# Patient Record
Sex: Female | Born: 1935 | Race: White | Hispanic: No | Smoking: Former smoker
Health system: Southern US, Community
[De-identification: ages and names within clinical notes are randomized; demographics above are authoritative.]

## PROBLEM LIST (undated history)

## (undated) DIAGNOSIS — R55 Syncope and collapse: Secondary | ICD-10-CM

## (undated) DIAGNOSIS — E785 Hyperlipidemia, unspecified: Secondary | ICD-10-CM

## (undated) DIAGNOSIS — D649 Anemia, unspecified: Secondary | ICD-10-CM

## (undated) DIAGNOSIS — I10 Essential (primary) hypertension: Secondary | ICD-10-CM

## (undated) DIAGNOSIS — I739 Peripheral vascular disease, unspecified: Secondary | ICD-10-CM

## (undated) DIAGNOSIS — E039 Hypothyroidism, unspecified: Secondary | ICD-10-CM

## (undated) DIAGNOSIS — I639 Cerebral infarction, unspecified: Secondary | ICD-10-CM

## (undated) HISTORY — DX: Hyperlipidemia, unspecified: E78.5

## (undated) HISTORY — DX: Anemia, unspecified: D64.9

## (undated) HISTORY — PX: CARDIAC CATHETERIZATION: SHX172

## (undated) HISTORY — DX: Cerebral infarction, unspecified: I63.9

## (undated) HISTORY — PX: VASCULAR SURGERY: SHX849

## (undated) HISTORY — DX: Peripheral vascular disease, unspecified: I73.9

## (undated) HISTORY — DX: Essential (primary) hypertension: I10

## (undated) HISTORY — DX: Hypothyroidism, unspecified: E03.9

## (undated) HISTORY — DX: Syncope and collapse: R55

---

## 2006-07-15 ENCOUNTER — Observation Stay (HOSPITAL_COMMUNITY): Admission: RE | Admit: 2006-07-15 | Discharge: 2006-07-16 | Payer: Self-pay | Admitting: *Deleted

## 2006-11-26 ENCOUNTER — Ambulatory Visit: Payer: Self-pay | Admitting: Otolaryngology

## 2006-12-14 ENCOUNTER — Other Ambulatory Visit: Payer: Self-pay

## 2006-12-14 ENCOUNTER — Inpatient Hospital Stay: Payer: Self-pay | Admitting: Endocrinology

## 2006-12-23 ENCOUNTER — Encounter: Payer: Self-pay | Admitting: Hematology & Oncology

## 2007-05-21 ENCOUNTER — Ambulatory Visit: Payer: Self-pay | Admitting: *Deleted

## 2007-05-25 ENCOUNTER — Encounter: Payer: Self-pay | Admitting: Cardiovascular Disease

## 2007-07-23 ENCOUNTER — Emergency Department: Payer: Self-pay | Admitting: Emergency Medicine

## 2007-07-23 ENCOUNTER — Other Ambulatory Visit: Payer: Self-pay

## 2007-12-13 ENCOUNTER — Emergency Department: Payer: Self-pay

## 2007-12-13 ENCOUNTER — Other Ambulatory Visit: Payer: Self-pay

## 2008-02-24 ENCOUNTER — Encounter: Admission: RE | Admit: 2008-02-24 | Discharge: 2008-02-24 | Payer: Self-pay | Admitting: Neurology

## 2008-03-23 ENCOUNTER — Emergency Department: Payer: Self-pay | Admitting: Emergency Medicine

## 2008-07-12 ENCOUNTER — Ambulatory Visit: Payer: Self-pay | Admitting: Gastroenterology

## 2008-07-27 ENCOUNTER — Encounter: Payer: Self-pay | Admitting: Cardiovascular Disease

## 2008-07-27 ENCOUNTER — Inpatient Hospital Stay (HOSPITAL_COMMUNITY): Admission: AD | Admit: 2008-07-27 | Discharge: 2008-07-29 | Payer: Self-pay | Admitting: Cardiology

## 2008-09-05 ENCOUNTER — Encounter: Payer: Self-pay | Admitting: Cardiovascular Disease

## 2008-10-05 ENCOUNTER — Ambulatory Visit: Payer: Self-pay | Admitting: Neurology

## 2009-01-03 ENCOUNTER — Encounter: Payer: Self-pay | Admitting: Cardiovascular Disease

## 2009-02-01 ENCOUNTER — Encounter: Payer: Self-pay | Admitting: Cardiovascular Disease

## 2009-02-23 ENCOUNTER — Ambulatory Visit: Payer: Self-pay | Admitting: Internal Medicine

## 2009-05-09 ENCOUNTER — Encounter: Payer: Self-pay | Admitting: Cardiovascular Disease

## 2009-07-13 ENCOUNTER — Ambulatory Visit: Payer: Self-pay | Admitting: Neurology

## 2009-11-23 ENCOUNTER — Ambulatory Visit: Payer: Self-pay | Admitting: Internal Medicine

## 2009-11-28 ENCOUNTER — Ambulatory Visit: Payer: Self-pay | Admitting: Internal Medicine

## 2009-11-29 ENCOUNTER — Ambulatory Visit: Payer: Self-pay | Admitting: Internal Medicine

## 2010-01-03 ENCOUNTER — Ambulatory Visit: Payer: Self-pay | Admitting: Specialist

## 2010-03-07 ENCOUNTER — Ambulatory Visit: Payer: Self-pay | Admitting: Internal Medicine

## 2011-01-07 ENCOUNTER — Encounter: Payer: Self-pay | Admitting: Cardiovascular Disease

## 2011-01-21 ENCOUNTER — Ambulatory Visit
Admission: RE | Admit: 2011-01-21 | Discharge: 2011-01-21 | Payer: Self-pay | Source: Home / Self Care | Attending: Cardiovascular Disease | Admitting: Cardiovascular Disease

## 2011-01-21 ENCOUNTER — Encounter: Payer: Self-pay | Admitting: Cardiovascular Disease

## 2011-01-21 DIAGNOSIS — E785 Hyperlipidemia, unspecified: Secondary | ICD-10-CM | POA: Insufficient documentation

## 2011-01-21 DIAGNOSIS — R011 Cardiac murmur, unspecified: Secondary | ICD-10-CM | POA: Insufficient documentation

## 2011-01-21 DIAGNOSIS — R5383 Other fatigue: Secondary | ICD-10-CM

## 2011-01-21 DIAGNOSIS — R0602 Shortness of breath: Secondary | ICD-10-CM | POA: Insufficient documentation

## 2011-01-21 DIAGNOSIS — I739 Peripheral vascular disease, unspecified: Secondary | ICD-10-CM | POA: Insufficient documentation

## 2011-01-21 DIAGNOSIS — R5381 Other malaise: Secondary | ICD-10-CM | POA: Insufficient documentation

## 2011-01-21 DIAGNOSIS — I2510 Atherosclerotic heart disease of native coronary artery without angina pectoris: Secondary | ICD-10-CM | POA: Insufficient documentation

## 2011-01-29 NOTE — Assessment & Plan Note (Signed)
Summary: NP6/AMD   Visit Type:  Initial Consult Primary Provider:  Aletha Halim, M.D.  CC:  Former patient at [facility].  c/o feeling tired and weak; doesn't have to do anything to feel worn out.  Has some shortness of breath with over exertion.Marland Kitchen  History of Present Illness: Linda Rasmussen is 77 year old woman with a history of coronary artery disease, peripheral vascular disease, previous stroke, hyperlipidemia who is intolerant of statins, hypertension, previous PCI to her lower extremity, chronic leg weakness, fatigue, long smoking history and COPD who presents to reestablish care. She was previously seen by myself at [facility].  She reports that for the past year, she has had weakness and fatigue. She is uncertain why she continues to have this. She is not very active but does have some shortness of breath with exertion. She does not walk long distances and has to take frequent breaks when exerting herself secondary to fatigue and breathing.   She has lab work from late in 2012 showing total cholesterol 220. She reports having a carotid ultrasound by Dr. Clelia Croft showing moderate disease of her left carotid but the details are unavailable to [place].  Cardiac catheterization performed in August 2009 showed circumflex vessel with 40% mid disease, 20-30% proximal disease, 80-90% mid to distal disease, LAD with 50% proximal, 70% mid disease, 95% ostial diagonal disease, 50% mid to distal disease of the LAD. She had stent placed to her left circumflex that was 2.5 x 14 mm endevour stent.  EKG today shows normal sinus rhythm with rate 63 beats per minute, possible old anterior infarct, interventricular conduction delay, nonspecific ST and T wave abnormality in anterolateral leads  Preventive Screening-Counseling & Management  Alcohol-Tobacco     Smoking Status: current  Caffeine-Diet-Exercise     Does Patient Exercise: no  Current Medications (verified): 1)  Atenolol 25 Mg Tabs  (Atenolol) .... One Tablet Once Daily 2)  Isosorbide Mononitrate Cr 30 Mg Xr24h-Tab (Isosorbide Mononitrate) .Marland Kitchen.. 1 Tablet Every Morning 3)  Plavix 75 Mg Tabs (Clopidogrel Bisulfate) .Marland Kitchen.. 1 Tablet Daily 4)  Buspirone Hcl 10 Mg Tabs (Buspirone Hcl) .Marland Kitchen.. 1 Tablet By Mouth Two Times A Day 5)  Gabapentin 400 Mg Caps (Gabapentin) .Marland Kitchen.. 1 Tablet Once Daily 6)  Zetia 10 Mg Tabs (Ezetimibe) .... One Tablet Once Daily 7)  Tramadol Hcl 50 Mg Tabs (Tramadol Hcl) .... One Tablet As Needed For Back Pain 8)  Vesicare 5 Mg Tabs (Solifenacin Succinate) .... As Needed 9)  Armour Thyroid 60 Mg Tabs (Thyroid) .... One Tablet Once Daily 10)  Calcium Carbonate 600 Mg Tabs (Calcium Carbonate) .... One Tablet Once Daily 11)  Daily Multi  Tabs (Multiple Vitamins-Minerals) .... One Tablet Once Daily  Allergies (verified): 1)  ! * Statins 2)  ! Trental 3)  ! * Lyrica 4)  ! * Rozepem 5)  ! * Keppra 6)  ! Reglan 7)  ! * Cymbalta  Past History:  Past Medical History: Last updated: 01/17/2011 Hypothyroidism Asthma Diabetes Type 2 Hypertension Anemia Hyperlipidemia Previous stroke PVD h/x of syncope  Family History: Last updated: 01/17/2011 Family history for coronary artery disease, diabetes, and stroke  Social History: Last updated: 01/21/2011 Retired  Widowed  Tobacco Use - Yes. Smokes >1/2 ppd. Started age 38. Alcohol Use - no Regular Exercise - no  Risk Factors: Exercise: no (01/21/2011)  Risk Factors: Smoking Status: current (01/21/2011)  Past Surgical History: Cardiac Cath; s/p stent placement @ [place] [facility]; carotid stenosis stents bilateral legs.  Dr. Orson Slick  Social History: Retired  Widowed  Tobacco Use - Yes. Smokes >1/2 ppd. Started age 53. Alcohol Use - no Regular Exercise - no Smoking Status:  current Does Patient Exercise:  no  Review of Systems       The patient complains of weight loss and dyspnea on exertion.  The patient denies fever, weight gain, vision  loss, decreased hearing, hoarseness, chest pain, syncope, peripheral edema, prolonged cough, abdominal pain, incontinence, muscle weakness, depression, and enlarged lymph nodes.         Fatigue, weakness  Vital Signs:  Patient profile:   77 year old female Height:      62 inches Weight:      106 pounds BMI:     19.46 Pulse rate:   63 / minute BP sitting:   98 / 54  (left arm) Cuff size:   regular  Vitals Entered By: Bishop Dublin, CMA (January 21, 2011 2:53 PM)  Physical Exam  General:  very thin woman in no apparent distress Head:  normocephalic and atraumatic Neck:  Neck supple, no JVD. No masses, thyromegaly or abnormal cervical nodes. Lungs:  mildly decreased breath sounds throughout Heart:  Non-displaced PMI, chest non-tender; regular rate and rhythm, S1, S2 without murmurs, rubs or gallops. Carotid upstroke normal, 1+ bruit b/l. Normal abdominal aortic size, no bruits. Femorals normal pulses, no bruits. Pedals normal pulses. No edema, no varicosities. Abdomen:   very thin Msk:  Back normal, normal gait. Muscle strength and tone normal. Pulses:  diminished pulses in her lower extremities Extremities:  No clubbing or cyanosis. Neurologic:  Alert and oriented x 3. Skin:  Intact without lesions or rashes. Psych:  Normal affect.   Impression & Recommendations:  Problem # 1:  CAD (ICD-414.00) she has a history of severe coronary artery disease with stent placement in 2009. She has worsening fatigue, malaise, some shortness of breath with exertion. We ordered a likely scan Myoview as she is unable to treadmill to rule out ischemia as a cause of her symptom.  Her updated medication list for this problem includes:    Atenolol 25 Mg Tabs (Atenolol) ..... One tablet once daily    Isosorbide Mononitrate Cr 30 Mg Xr24h-tab (Isosorbide mononitrate) .Marland Kitchen... 1 tablet every morning    Plavix 75 Mg Tabs (Clopidogrel bisulfate) .Marland Kitchen... 1 tablet daily  Orders: Nuclear Stress Test (Nuc Stress  Test)  Problem # 2:  MURMUR (ICD-785.2) She does have a murmur on exam, given her shortness of breath, Voltaren echocardiogram. This has not been done for many years.  Her updated medication list for this problem includes:    Atenolol 25 Mg Tabs (Atenolol) ..... One tablet once daily    Isosorbide Mononitrate Cr 30 Mg Xr24h-tab (Isosorbide mononitrate) .Marland Kitchen... 1 tablet every morning  Orders: Echocardiogram (Echo)  Problem # 3:  UNSPECIFIED PERIPHERAL VASCULAR DISEASE (ICD-443.9) She does have severe peripheral vascular disease. She continues to smoke. We have counseled her on smoking cessation and she reports that she will continue to try to stop smoking. We ordered a lower extremity ultrasound to rule out leg weakness. this can be done at the same time as her echocardiogram in our office for convenience.  Problem # 4:  HYPERLIPIDEMIA-MIXED (ICD-272.4) for her cholesterol, we have suggested she try Pravachol 20 mg daily in addition to zetia. She believes she has problems with most statins though we have asked her to try the weakest of the statins as it is imperative we push her cholesterol closer  to goal of 150.  Her updated medication list for this problem includes:    Zetia 10 Mg Tabs (Ezetimibe) ..... One tablet once daily    Pravastatin Sodium 20 Mg Tabs (Pravastatin sodium) .Marland Kitchen... Take one tablet by mouth daily at bedtime  Problem # 5:  FATIGUE / MALAISE (ICD-780.79) Etiology of her fatigue and general weakness may be multifactorial. We will exclude cardiac etiology with a stress test and echocardiogram. Ultrasound of her legs to exclude worsening peripheral vascular disease. She could be deconditioned as well as may have mild depression.  Other Orders: Arterial Duplex Lower Extremity (Arterial Duplex Low)  Patient Instructions: 1)  Your physician recommends that you follow up after your stress test. 2)  Your physician has recommended you make the following change in your medication:  START Pravastatin 20mg  once daily. 3)  Your physician has requested that you have a lower extremity arterial duplex.  This test is an ultrasound of the arteries in the legs or arms.  It looks at arterial blood flow in the legs and arms.  Allow one hour for Lower and Upper Arterial scans. There are no restrictions or special instructions. 4)  Your physician has requested that you have an echocardiogram.  Echocardiography is a painless test that uses sound waves to create images of your heart. It provides your doctor with information about the size and shape of your heart and how well your heart's chambers and valves are working.  This procedure takes approximately one hour. There are no restrictions for this procedure. 5)  Your physician has requested that you have an exercise stress myoview.  For further information please visit [email or web address]  Please follow instruction sheet, as given. Prescriptions: PRAVASTATIN SODIUM 20 MG TABS (PRAVASTATIN SODIUM) Take one tablet by mouth daily at bedtime  #30 x 6   Entered by:   Lanny Hurst RN   Authorized by:   Dossie Arbour MD   Signed by:   Lanny Hurst RN on 01/21/2011   Method used:   Electronically to        [facility] [employer] [phone]* (retail)       [address] [place], [place]  [address]       Ph: [phone] or [phone]       Fax: [phone]   RxID:   [id number]

## 2011-01-31 ENCOUNTER — Telehealth: Payer: Self-pay | Admitting: Cardiovascular Disease

## 2011-01-31 ENCOUNTER — Telehealth (INDEPENDENT_AMBULATORY_CARE_PROVIDER_SITE_OTHER): Payer: Self-pay | Admitting: *Deleted

## 2011-02-04 ENCOUNTER — Encounter: Payer: Self-pay | Admitting: Cardiology

## 2011-02-04 ENCOUNTER — Ambulatory Visit (HOSPITAL_COMMUNITY): Payer: Medicare Other | Attending: Cardiovascular Disease

## 2011-02-04 DIAGNOSIS — R0989 Other specified symptoms and signs involving the circulatory and respiratory systems: Secondary | ICD-10-CM

## 2011-02-04 DIAGNOSIS — I2510 Atherosclerotic heart disease of native coronary artery without angina pectoris: Secondary | ICD-10-CM | POA: Insufficient documentation

## 2011-02-04 DIAGNOSIS — R0789 Other chest pain: Secondary | ICD-10-CM

## 2011-02-04 DIAGNOSIS — R0602 Shortness of breath: Secondary | ICD-10-CM | POA: Insufficient documentation

## 2011-02-06 NOTE — Progress Notes (Signed)
Summary: Nuclear Pre-Procedure  Phone Note Outgoing Call Call back at [facility] Phone [phone]   Call placed by: Stanton Kidney, EMT-P,  January 31, 2011 12:07 PM Call placed to: Patient Action Taken: Phone Call Completed Summary of Call: Reviewed information on Myoview Information Sheet (see scanned document for further details).  Spoke with the patient. Stanton Kidney, EMT-P  January 31, 2011 12:07 PM     Nuclear Med Background Indications for Stress Test: Evaluation for Ischemia, Stent Patency   History: Asthma, COPD, Echo, Heart Catheterization, Stents  History Comments: 8/09 Heart Cath > Cfx stent  Symptoms: DOE, Fatigue, SOB    Nuclear Pre-Procedure Cardiac Risk Factors: Carotid Disease, CVA, Family History - CAD, Hypertension, Lipids, NIDDM, PVD, Smoker Height (in): 62

## 2011-02-06 NOTE — Progress Notes (Signed)
Summary: ? about pravastatin  Phone Note Call from Patient Call back at [facility] Phone [phone]   Caller: Patient Call For: Nurse Summary of Call: pt  started on pravastatin about a week ago. pt has had trouble with statin medications before. pt is currenty having pain in legs and joints. she wanted to know if Dr. Mariah Milling wanted to try her on another medication.  Please advise. Initial call taken by: Lysbeth Galas CMA,  January 31, 2011 12:29 PM  Follow-up for Phone Call        She has taken lipitor, zocor, crestor, simvastatin in the past and had the same muscle aches. She has restless leg and seems to be worse since started the pravastatin. Should she take 1/2 tablet once daily? Follow-up by: Bishop Dublin, CMA,  January 31, 2011 4:43 PM  Additional Follow-up for Phone Call Additional follow up Details #1::        Notified patient to cut tablet in 1/2 and then let us know if that helped.  Told her that she does need to be on the pravastatin 20 mg with her vascular disease and due to increased cholesterol.  She understands but doesn't think can tolerate the pain from the 20mg .  She will cut tablet in half. Additional Follow-up by: Bishop Dublin, CMA,  January 31, 2011 5:02 PM

## 2011-02-12 NOTE — Assessment & Plan Note (Signed)
Summary: Cardiology Nuclear Testing  Nuclear Med Background Indications for Stress Test: Evaluation for Ischemia, Stent Patency   History: Asthma, COPD, Heart Catheterization, Stents  History Comments: '08 [id number]:[id number]; '09 Stent-Cfx  Symptoms: Dizziness, DOE, Fatigue, Near Syncope    Nuclear Pre-Procedure Cardiac Risk Factors: Carotid Disease, CVA, Family History - CAD, Hypertension, Lipids, NIDDM, PVD, Smoker Caffeine/Decaff Intake: None NPO After: 6:00 PM Lungs: Clear.  O2 sat 96% on RA. IV 0.9% NS with Angio Cath: 20g     IV Site: R Wrist IV Started by: Stanton Kidney, EMT-P Chest Size (in) 34     Cup Size B     Height (in): 62 Weight (lb): 103 BMI: 18.91 Tech Comments: Atenolol held this a.m.  Nuclear Med Study 1 or 2 day study:  1 day     Stress Test Type:  Adenosine Reading MD:  Olga Millers, MD     Referring MD:  Julien Nordmann Resting Radionuclide:  Technetium 78m Tetrofosmin     Resting Radionuclide Dose:  10.9 mCi  Stress Radionuclide:  Technetium 65m Tetrofosmin     Stress Radionuclide Dose:  33 mCi   Stress Protocol  Dose of Adenosine:  26.2 mg    Stress Test Technologist:  Rea College, CMA-N     Nuclear Technologist:  Doyne Keel, CNMT  Rest Procedure  Myocardial perfusion imaging was performed at rest 45 minutes following the intravenous administration of Technetium 75m Tetrofosmin.  Stress Procedure  The patient received IV adenosine at 140 mcg/kg/min for 4 minutes. There was a brief episode of complete heart block with infusion. Technetium 70m Tetrofosmin was injected at the 2 minute mark and quantitative spect images were obtained after a 45 minute delay.  QPS Raw Data Images:  Acquisition technically good; normal left ventricular size. Stress Images:  There is decreased uptake in the septum. Rest Images:  There is decreased uptake in the septum. Subtraction (SDS):  No evidence of ischemia. Transient Ischemic Dilatation:  1.14  (Normal <1.22)  Lung/Heart Ratio:  .27  (Normal <0.45)  Quantitative Gated Spect Images QGS EDV:  37 ml QGS ESV:  7 ml QGS EF:  81 % QGS cine images:  Normal LV function.   Overall Impression  Exercise Capacity: Adenosine study with no exercise. BP Response: Normal blood pressure response. Clinical Symptoms: No chest pain ECG Impression: No significant ST segment change suggestive of ischemia. Overall Impression: Probable normal adenosine nuclear study with mild septal thinning but no ischemia.  Appended Document: Cardiology Nuclear Testing stress test looks ok No need for cardiac cath based on the report  Appended Document: Cardiology Nuclear Testing pt notified/sab

## 2011-02-18 ENCOUNTER — Encounter (INDEPENDENT_AMBULATORY_CARE_PROVIDER_SITE_OTHER): Payer: Medicare Other

## 2011-02-18 ENCOUNTER — Other Ambulatory Visit: Payer: Self-pay | Admitting: Cardiovascular Disease

## 2011-02-18 ENCOUNTER — Encounter: Payer: Self-pay | Admitting: Cardiovascular Disease

## 2011-02-18 ENCOUNTER — Other Ambulatory Visit (INDEPENDENT_AMBULATORY_CARE_PROVIDER_SITE_OTHER): Payer: Medicare Other

## 2011-02-18 DIAGNOSIS — I739 Peripheral vascular disease, unspecified: Secondary | ICD-10-CM

## 2011-02-18 DIAGNOSIS — R0602 Shortness of breath: Secondary | ICD-10-CM

## 2011-02-18 NOTE — Progress Notes (Signed)
Summary: [facility] - Comprehensive Follow-up Exam  [facility] - Comprehensive Follow-up Exam   Imported By: Earl Many 02/12/2011 09:56:30  _____________________________________________________________________  External Attachment:    Type:   Image     Comment:   External Document

## 2011-02-27 ENCOUNTER — Telehealth: Payer: Self-pay | Admitting: Cardiovascular Disease

## 2011-02-27 NOTE — Letter (Signed)
Summary: [facility] Office Note  [facility] Office Note   Imported By: Roderic Ovens 02/17/2011 11:49:58  _____________________________________________________________________  External Attachment:    Type:   Image     Comment:   External Document

## 2011-02-27 NOTE — Letter (Signed)
Summary: [facility] Office Note   [facility] Office Note   Imported By: Roderic Ovens 02/17/2011 11:44:32  _____________________________________________________________________  External Attachment:    Type:   Image     Comment:   External Document

## 2011-02-27 NOTE — Letter (Signed)
Summary: [facility] [place], [place] [address]  [facility] [place], [place] 2008   Imported By: Roderic Ovens 02/21/2011 10:05:03  _____________________________________________________________________  External Attachment:    Type:   Image     Comment:   External Document

## 2011-03-01 ENCOUNTER — Encounter: Payer: Self-pay | Admitting: Cardiovascular Disease

## 2011-03-04 ENCOUNTER — Ambulatory Visit (INDEPENDENT_AMBULATORY_CARE_PROVIDER_SITE_OTHER): Payer: Medicare Other | Admitting: Cardiovascular Disease

## 2011-03-04 ENCOUNTER — Encounter: Payer: Self-pay | Admitting: Cardiovascular Disease

## 2011-03-04 DIAGNOSIS — I739 Peripheral vascular disease, unspecified: Secondary | ICD-10-CM

## 2011-03-04 DIAGNOSIS — E785 Hyperlipidemia, unspecified: Secondary | ICD-10-CM

## 2011-03-04 DIAGNOSIS — I2510 Atherosclerotic heart disease of native coronary artery without angina pectoris: Secondary | ICD-10-CM

## 2011-03-04 DIAGNOSIS — R0989 Other specified symptoms and signs involving the circulatory and respiratory systems: Secondary | ICD-10-CM | POA: Insufficient documentation

## 2011-03-04 NOTE — Cardiovascular Report (Signed)
Summary: [place]   [place]   Imported By: Roderic Ovens 02/27/2011 15:37:29  _____________________________________________________________________  External Attachment:    Type:   Image     Comment:   External Document

## 2011-03-04 NOTE — Progress Notes (Signed)
Summary: Returning call  Phone Note Call from Patient Call back at Home Phone [phone]   Caller: Self Call For: Linda Rasmussen Summary of Call: Returning a call to [facility]. Initial call taken by: Harlon Flor,  February 27, 2011 3:53 PM  Follow-up for Phone Call        pt notified of echo results. has f/u with Dr.Nhi Butrum 03/04/11 @ 11am Lysbeth Galas CMA  February 27, 2011 4:03 PM

## 2011-03-10 ENCOUNTER — Other Ambulatory Visit: Payer: Self-pay | Admitting: Cardiovascular Disease

## 2011-03-10 DIAGNOSIS — R0989 Other specified symptoms and signs involving the circulatory and respiratory systems: Secondary | ICD-10-CM

## 2011-03-11 ENCOUNTER — Ambulatory Visit: Payer: Self-pay | Admitting: Internal Medicine

## 2011-03-11 NOTE — Assessment & Plan Note (Signed)
Summary: F/U from Stress Test, ECHO, and Arterial Lower/AMD   Visit Type:  Follow-up Primary Provider:  Aletha Halim, M.D.  CC:  c/o dizziness and losing balance and still hasn't gotten any better. Denies chest pain and SOB.Marland Kitchen  History of Present Illness: Linda Rasmussen is 77 year old woman with a history of coronary artery disease, peripheral vascular disease, previous stroke, hyperlipidemia,  hypertension, previous PCI to her lower extremity, chronic leg weakness, fatigue, long smoking history and COPD who presents for routine followup.  she continues to have leg weakness. She reports being active and does exercises on an occasional basis.  Her echocardiogram was essentially normal with mild valvular disease. ABIs in her legs did not suggest significant stent stenosis and no further intervention needed. Stress test showed no significant ischemia.  She has lab work from late in 2011 showing total cholesterol 220. She reports having a carotid ultrasound by Dr. Clelia Croft showing moderate disease of her left carotid but the details are unavailable to [place].  Cardiac catheterization performed in August 2009 showed circumflex vessel with 40% mid disease, 20-30% proximal disease, 80-90% mid to distal disease, LAD with 50% proximal, 70% mid disease, 95% ostial diagonal disease, 50% mid to distal disease of the LAD. She had stent placed to her left circumflex that was 2.5 x 14 mm endevour stent.  Old EKG  shows normal sinus rhythm with rate 63 beats per minute, possible old anterior infarct, interventricular conduction delay, nonspecific ST and T wave abnormality in anterolateral leads  Current Medications (verified): 1)  Atenolol 25 Mg Tabs (Atenolol) .... One Tablet Once Daily 2)  Isosorbide Mononitrate Cr 30 Mg Xr24h-Tab (Isosorbide Mononitrate) .Marland Kitchen.. 1 Tablet Every Morning 3)  Plavix 75 Mg Tabs (Clopidogrel Bisulfate) .Marland Kitchen.. 1 Tablet Daily 4)  Buspirone Hcl 10 Mg Tabs (Buspirone Hcl) .Marland Kitchen.. 1 Tablet By Mouth Two  Times A Day 5)  Gabapentin 400 Mg Caps (Gabapentin) .Marland Kitchen.. 1 Tablet Three Times A Day 6)  Zetia 10 Mg Tabs (Ezetimibe) .... One Tablet Once Daily 7)  Vesicare 5 Mg Tabs (Solifenacin Succinate) .... As Needed 8)  Armour Thyroid 60 Mg Tabs (Thyroid) .... One Tablet Once Daily 9)  Calcium Carbonate 600 Mg Tabs (Calcium Carbonate) .... One Tablet Once Daily 10)  Daily Multi  Tabs (Multiple Vitamins-Minerals) .... One Tablet Once Daily 11)  Pravastatin Sodium 20 Mg Tabs (Pravastatin Sodium) .... Take One Tablet By Mouth Daily At Bedtime  Allergies (verified): 1)  ! * Statins 2)  ! Trental 3)  ! * Lyrica 4)  ! * Rozepem 5)  ! * Keppra 6)  ! Reglan 7)  ! * Cymbalta  Past History:  Past Medical History: Last updated: 01/17/2011 Hypothyroidism Asthma Diabetes Type 2 Hypertension Anemia Hyperlipidemia Previous stroke PVD h/x of syncope  Past Surgical History: Last updated: 01/21/2011 Cardiac Cath; s/p stent placement @ [place] [facility]; carotid stenosis stents bilateral legs.  Dr. Orson Slick  Family History: Last updated: 01/17/2011 Family history for coronary artery disease, diabetes, and stroke  Social History: Last updated: 01/21/2011 Retired  Widowed  Tobacco Use - Yes. Smokes >1/2 ppd. Started age 13. Alcohol Use - no Regular Exercise - no  Risk Factors: Exercise: no (01/21/2011)  Risk Factors: Smoking Status: current (01/21/2011)  Review of Systems  The patient denies fever, weight loss, weight gain, vision loss, decreased hearing, hoarseness, chest pain, syncope, dyspnea on exertion, peripheral edema, prolonged cough, abdominal pain, incontinence, muscle weakness, depression, and enlarged lymph nodes.  leg weakness  Vital Signs:  Patient profile:   77 year old female Height:      62 inches Weight:      103.75 pounds BMI:     19.04 Pulse rate:   68 / minute BP sitting:   112 / 62  (left arm) Cuff size:   regular  Vitals Entered By: Lysbeth Galas CMA  (March 04, 2011 11:01 AM)  Physical Exam  General:  very thin woman in no apparent distress, smells of smoke Head:  normocephalic and atraumatic Neck:  Neck supple, no JVD. No masses, thyromegaly or abnormal cervical nodes. Lungs:  Clear bilaterally to auscultation and percussion. Heart:  Non-displaced PMI, chest non-tender; regular rate and rhythm, S1, S2 without murmurs, rubs or gallops. Carotid upstroke normal,  bruit on the left 2+.  Pedals normal pulses. No edema, no varicosities. Abdomen:  Bowel sounds positive; abdomen soft and non-tender without masses. thin Msk:  Back normal, normal gait. Muscle strength and tone normal. Pulses:  diminished pulses in her lower extremities Extremities:  No clubbing or cyanosis. Neurologic:  Alert and oriented x 3. Skin:  Intact without lesions or rashes. Psych:  Normal affect.   Impression & Recommendations:  Problem # 1:  CAROTID BRUIT, LEFT (ICD-785.9) 50% left carotid arterial disease. Repeat carotid in 2013.  Future Orders: Carotid Duplex (Carotid Duplex) ... 11/22/2011  Problem # 2:  HYPERLIPIDEMIA-MIXED (ICD-272.4) Continue pravastatin and zetia. She has a difficult time tolerating statins.  Her updated medication list for this problem includes:    Zetia 10 Mg Tabs (Ezetimibe) ..... One tablet once daily    Pravastatin Sodium 20 Mg Tabs (Pravastatin sodium) .Marland Kitchen... Take one tablet by mouth daily at bedtime  Problem # 3:  CAD (ICD-414.00) Negative stress test. Continue aggressive medical management.  Her updated medication list for this problem includes:    Atenolol 25 Mg Tabs (Atenolol) ..... One tablet once daily    Isosorbide Mononitrate Cr 30 Mg Xr24h-tab (Isosorbide mononitrate) .Marland Kitchen... 1 tablet every morning    Plavix 75 Mg Tabs (Clopidogrel bisulfate) .Marland Kitchen... 1 tablet daily  Problem # 4:  UNSPECIFIED PERIPHERAL VASCULAR DISEASE (ICD-443.9) Stents to her doctor May arterial system. ABIs are normal.  Patient Instructions: 1)   Your physician recommends that you schedule a follow-up appointment in: 1 year 2)  Your physician recommends that you continue on your current medications as directed. Please refer to the Current Medication list given to you today. 3)  TO BE SCHEDULED IN 1 YEAR: Your physician has requested that you have a carotid duplex. This test is an ultrasound of the carotid arteries in your neck. It looks at blood flow through these arteries that supply the brain with blood. Allow one hour for this exam. There are no restrictions or special instructions.

## 2011-05-06 NOTE — Discharge Summary (Signed)
Linda Rasmussen, Linda Rasmussen                   ACCOUNT NO.:  [id number]   MEDICAL RECORD NO.:  [id number]          PATIENT TYPE:  INP   LOCATION:  2627                         FACILITY:  [facility]   PHYSICIAN:  Cristy Hilts. Jacinto Halim, MD       DATE OF BIRTH:  May 21, 1925   DATE OF ADMISSION:  07/27/2008  DATE OF DISCHARGE:  07/29/2008                               DISCHARGE SUMMARY   DISCHARGE DIAGNOSES:  1. Coronary artery disease status post percutaneous coronary      intervention to circumflex.  The patient still has residual diffuse      coronary disease, especially in the right coronary artery and      diagonal branches and might need ultrasound-guided left anterior      descending intervention if EKG changes persists.  The patient has      abnormal enzymes.  2. Hyperlipidemia with statin intolerance.  3. Peripheral vascular disease with bilateral carotid artery stenosis.  4. History of transient ischemic attack and cerebrovascular accident      with slurred speech and facial droop.  5. Hypothyroidism, on supplemental therapy.   This is an 77 year old lady with known peripheral vascular disease who  was recently seen in our office with complaints of tightness in the  chest, fatigue, and shortness of breath with symptoms were very limiting  to her lifestyle and the patient underwent stress test sometime last  year which showed minimal stress-induced ischemia in the inferior and  apical walls of the left ventricle with normal EF.  However, her  symptoms remained asymptomatic but given the progression of angina, Dr.  Mariah Milling who saw the patient in our office on July 24, 2008, scheduled  her for coronary angiography.   The patient presented to [facility] on July 27, 2008, for  procedure with Dr. Jacinto Halim.  Cath revealed ejection fraction of 70% and  left ventricular outflow tract gradient 14 mmHg.  No significant mitral  regurgitation.  She has diffuse coronary disease but the most severe  lesion  of 90% was in the distal circumflex which was stented with drug-  eluting stent and great result.  However, there was a large eccentric  stenosis of 70% in the mid LAD right after takeoff of diagonal 1 branch  and ostium of diagonal 1 had 95% stenosis.  At this point, no  intervention was performed by Dr. Jacinto Halim who felt like if the patient  remains symptomatic or develops EKG changes or enzyme abnormalities, she  would need IVUS-guided intervention of the LAD.  The next morning, she  was seen by Dr. Jacinto Halim and because of the elevated enzymes, she was kept  in the hospital for observation.  We cycled her cardiac enzymes which  revealed the following; first set CK 136, CK-MB 24.9, and troponin 0.85.  Second set CK 196, CK-MB 37.6, and troponin 2.23.  Third set revealed CK  162, CK-MB 24.7, and troponin 2.36.  The following set revealed  improvement with CK 139, CK-MB 16.3, and troponin 1.67.   The next morning, the patient was seen in rounds  by Dr. Lynnea Ferrier who felt  like the patient to could be discharged home and she was stable from  cardiovascular standpoint.   The rest of the hospital laboratories revealed sodium 132, potassium  4.0, chloride 97, CO2 of 27, glucose 123, BUN 10, and creatinine 0.85.  CBC showed white blood cells 10.4, hemoglobin 11.5, hematocrit 33.8, and  platelet count 256.   DISCHARGE MEDICATIONS:  1. Aspirin 325 mg daily.  2. Plavix 75 mg daily.  3. Armour Thyroid  60 mg daily.  4. Neurontin 300 mg q.a.m. and 600 mg q.p.m.  5. Imdur 30 mg daily.  6. Atenolol 25 mg daily.  7. Zetia 10 mg daily.  8. Os-Cal daily.  9. Tramadol as needed q.8-12 h.   DISCHARGE DIET:  Low-fat, low-salt, low-cholesterol diet.   The patient was instructed not to drive or lift weights greater than 5  pounds for 3 days post-cath.   DISCHARGE FOLLOWUP:  Will be scheduled with Dr. Mariah Milling in our office in  [place].      Linda Rasmussen, P.A.      Cristy Hilts. Jacinto Halim, MD   Electronically Signed    MK/MEDQ  D:  07/29/2008  T:  07/30/2008  Job:  [id number]   cc:   [facility]  Antonieta Iba, MD

## 2011-05-06 NOTE — Cardiovascular Report (Signed)
Linda Rasmussen, Linda Rasmussen                   ACCOUNT NO.:  [id number]   MEDICAL RECORD NO.:  [id number]          PATIENT TYPE:  OIB   LOCATION:  2807                         FACILITY:  [facility]   PHYSICIAN:  Cristy Hilts. Jacinto Halim, MD       DATE OF BIRTH:  [birth year]   DATE OF PROCEDURE:  07/27/2008  DATE OF DISCHARGE:                            CARDIAC CATHETERIZATION   PROCEDURES PERFORMED:  1. Left ventriculography.  2. Selective left coronary angiography.  3. Intravascular ultrasound interrogation of the circumflex coronary      artery.  4. PTCA and stenting of the mid circumflex coronary artery.   INDICATIONS:  Ms. Linda Rasmussen is an 77 year old female with known severe  peripheral arterial disease, history of strokes, hyperlipidemia, and  hypertension who has been having recurrent chest discomfort.  Previously, she had undergone stress test, which had revealed borderline  inferoapical ischemia.  However, medical therapy was continued.  In the  recent past, she has noticed significant increasing in chest discomfort  which is exertional.  Given her significant chest pain and patient  preference, she was brought directly to the cardiac catheterization lab  to evaluate her coronary anatomy.   HEMODYNAMIC DATA:  The ventricular pressure was 116/1 with end diastolic  pressure of 7 mmHg.  The aortic pressure was 133/66 with a mean of 90  mmHg.  There was a left ventricular outflow tract pressure gradient of  14 mmHg.   ANGIOGRAPHIC DATA:  Left ventricle.  Left ventricular systolic function  was supranormal with the ejection fraction of 70-75% with dynamic left  ventricle with cavity obliteration.   Right coronary artery.  The right coronary artery shows mild diffuse  calcification.  The ostium shows a 20% stenosis.  There was catheter-  induced spasm, which was relieved with intracoronary nitroglycerin  administration.  There was 50-60% stenosis at the proximal crux followed  by 20-30% mid stenosis and  a 40% stenosis in the mid-to-distal segment  of the RCA.   Left main coronary artery:  Left main coronary is mildly calcified,  which is very short that bifurcates immediately.   Circumflex artery.  Circumflex artery is a moderate-to-large caliber  vessel.  It has got mild diffuse luminal irregularity in the mid segment  about 40% stenosis, in the proximal segment about 20-30% stenosis.  The  mid-to-distal segment of the circumflex coronary artery showed an  eccentric what appeared to be likely 80-90% stenosis.   LAD.  LAD is a moderate-to-large caliber vessel.  It has got again  diffuse luminal irregularity with diffuse mild calcification.  There is  a 50% stenosis in the proximal LAD followed by an eccentric 70% stenosis  in some views appeared to be 70, but in some views appeared to be only  20-30% at the bifurcation of the diagonal.  The diagonal itself is very  small and has a ostial 90-95% stenosis.  The mid-to-distal segment of  the LAD showed a 50% stenosis.  Otherwise, there was mild diffuse  luminal irregularity.   INTRAVASCULAR ULTRASOUND DATA:  The intravascular ultrasound  interrogation of  the circumflex coronary artery was performed because of  eccentricity of the lesion to confirm the stenosis severity.  This  confirmed that the vessel diameter was 2.3 x 2.5.  There was a high-  grade stenosis noted at the mid segment of the circumflex coronary  artery and the lesion was abutting the catheter.   Interventional successful PTCA and stenting of the circumflex coronary  artery with implantation of a 2.5 x 14-mm drug-eluting Endeavor stent,  which was deployed at nominal 9 atmospheres pressure.  This was  postdilated wtih a 2.5 x 9-mm Sprinter [place] at 14 atmospheres pressure  with overall reduction of stenosis from 90% to 0% with brisk TIMI III to  TIMI III flow maintained at the end of the procedure.   Postprocedure, the IVUS was performed and this revealed excellent  wall  opposition.  The distal edge of the stent was smooth transition without  any evidence of dissection.   RECOMMENDATIONS:  The patient will be continued on aggressive medical  therapy.  She will need Plavix for at least a period of a year.  If she  continues to have chest pain, consideration can be given for ultrasound-  guided LAD intervention.   With balloon inflation in the circumflex coronary artery, there was  significant EKG changes were noted, which resolved immediately after  balloon deflation.   A total of 205 mL of contrast was utilized for diagnostic and  interventional procedure.   TECHNIQUE OF PROCEDURE:  Under usual sterile precautions, using a 6-  French right femoral arterial access, a 6-French multipurpose B2  catheter was advanced into the ascending aorta and then the left  ventricle left ventriculography was performed both in LAO and RAO  projection.  Catheter pulled into the ascending aorta.  Right coronary  artery was selectively engaged and angiography was performed.  Because  of suspicion for catheter-induced spasm through her micrograms of  intracoronary nitroglycerin was administered and angiography was  performed.  The catheter was then disengaged from the right coronary  artery and subselective injection of the right coronary artery was  performed to relieve the catheter induced spasm.  Then, the attention  was directed towards the left main coronary artery, which was engaged  and angiography was performed.  Intracoronary nitroglycerin 200 mcg was  also administered and angiography repeated.   TECHNIQUE OF INTERVENTION:  Exchanging the 6-French sheath to a 7-French  sheath, FL-4 guide was utilized to engage left main coronary artery.  FL-  3.0 and 3.5 guides were too small.  Side-hole catheter was utilized.  Using [employer], I was able to wire the circumflex coronary artery  without any difficulty.  Then, a Scimed I cross imaging catheter was   advanced into the circumflex coronary artery and careful pullback was  performed and the data was carefully analyzed.  Then, we decided to  proceed with predilatation with a 2.5 x 12-mm Sprinter, which was  performed at 12 atmospheres pressure, and following this haziness was  noted in the circumflex tight stenosis.  This was stented with a 2.5 x  14-mm Endeavor stent, which was deployed at 9 atmospheres pressure and  postdilated with a 2.5 x 9-mm Sprinter Regent at the 14 atmospheres  pressure.  Overall, the stenosis was reduced from 90% to 0%  with brisk flow.  Intracoronary nitroglycerin was administered,  angiography repeated.  Guidewire was withdrawn and angiography repeated.  Guide catheter disengaged and pulled out of the body.  The patient  tolerated  the procedure.  There was no immediate complication noted.      Cristy Hilts. Jacinto Halim, MD  Electronically Signed     JRG/MEDQ  D:  07/27/2008  T:  07/28/2008  Job:  [id number]   cc:   Antonieta Iba, MD  Alan Mulder, M.D.

## 2011-09-19 LAB — CARDIAC PANEL(CRET KIN+CKTOT+MB+TROPI)
CK, MB: 24.7 — ABNORMAL HIGH
Relative Index: 15.2 — ABNORMAL HIGH
Relative Index: 19.2 — ABNORMAL HIGH
Total CK: 136
Troponin I: 0.85
Troponin I: 1.67
Troponin I: 2.23
Troponin I: 2.36

## 2011-09-19 LAB — CBC
HCT: 33.8 — ABNORMAL LOW
MCHC: 34
MCV: 91.7
RBC: 3.68 — ABNORMAL LOW
WBC: 10.4

## 2011-09-19 LAB — BASIC METABOLIC PANEL
BUN: 10
CO2: 27
Calcium: 8.9
Chloride: 97
Creatinine, Ser: 0.85
GFR calc Af Amer: >60

## 2011-11-08 ENCOUNTER — Observation Stay: Payer: Self-pay | Admitting: Internal Medicine

## 2012-03-03 ENCOUNTER — Other Ambulatory Visit: Payer: Self-pay | Admitting: Cardiology

## 2012-03-03 DIAGNOSIS — R0989 Other specified symptoms and signs involving the circulatory and respiratory systems: Secondary | ICD-10-CM

## 2012-03-05 ENCOUNTER — Encounter: Payer: Medicare Other | Admitting: *Deleted

## 2012-03-09 ENCOUNTER — Ambulatory Visit: Payer: Medicare Other | Admitting: Cardiovascular Disease

## 2012-03-11 ENCOUNTER — Ambulatory Visit: Payer: Self-pay | Admitting: Internal Medicine

## 2012-03-17 ENCOUNTER — Encounter: Payer: Self-pay | Admitting: Cardiovascular Disease

## 2012-03-17 ENCOUNTER — Ambulatory Visit (INDEPENDENT_AMBULATORY_CARE_PROVIDER_SITE_OTHER): Payer: Medicare Other | Admitting: Cardiovascular Disease

## 2012-03-17 VITALS — BP 118/64 | HR 63 | Ht 62.0 in | Wt 97.0 lb

## 2012-03-17 DIAGNOSIS — I2510 Atherosclerotic heart disease of native coronary artery without angina pectoris: Secondary | ICD-10-CM

## 2012-03-17 DIAGNOSIS — R0989 Other specified symptoms and signs involving the circulatory and respiratory systems: Secondary | ICD-10-CM

## 2012-03-17 DIAGNOSIS — R5383 Other fatigue: Secondary | ICD-10-CM

## 2012-03-17 DIAGNOSIS — E785 Hyperlipidemia, unspecified: Secondary | ICD-10-CM

## 2012-03-17 DIAGNOSIS — R011 Cardiac murmur, unspecified: Secondary | ICD-10-CM

## 2012-03-17 DIAGNOSIS — R0602 Shortness of breath: Secondary | ICD-10-CM

## 2012-03-17 DIAGNOSIS — R5381 Other malaise: Secondary | ICD-10-CM

## 2012-03-17 DIAGNOSIS — I739 Peripheral vascular disease, unspecified: Secondary | ICD-10-CM

## 2012-03-17 NOTE — Assessment & Plan Note (Signed)
Shortness of breath likely from underlying COPD. She may benefit from aggressive inhaler regimen.

## 2012-03-17 NOTE — Assessment & Plan Note (Signed)
Long-standing symptoms. Possible depression. She does not want physical therapy.

## 2012-03-17 NOTE — Patient Instructions (Addendum)
You are doing well. No medication changes were made.  Please call us if you have new issues that need to be addressed before your next appt.  Your physician wants you to follow-up in: 6 months.  You will receive a reminder letter in the mail two months in advance. If you don't receive a letter, please call our office to schedule the follow-up appointment.   

## 2012-03-17 NOTE — Assessment & Plan Note (Signed)
She is unable to tolerate statins. Unable to tolerate red yeast rice.

## 2012-03-17 NOTE — Assessment & Plan Note (Signed)
50-60% left internal carotid arterial disease. Followed by Dr. Lorretta Harp.

## 2012-03-17 NOTE — Assessment & Plan Note (Signed)
Decreased radial pulse on the left. Known lower extremity arterial disease with stenting in the past. Known carotid disease. Followed by Dr. Lorretta Harp.

## 2012-03-17 NOTE — Assessment & Plan Note (Signed)
Currently with no symptoms of angina. No further workup at this time. Continue current medication regimen. 

## 2012-03-17 NOTE — Progress Notes (Signed)
Patient ID: Linda Rasmussen, female    DOB: 18-Sep-1925, 78 y.o.   MRN: [id number]  HPI Comments: Ms. Bellavance is 78 year old woman with a history of coronary artery disease, peripheral vascular disease, previous stroke, hyperlipidemia who is intolerant of statins, hypertension, previous PCI to her lower extremity, chronic leg weakness, fatigue/depression, long smoking history and COPD who presents for routine followup. Stress test last year that showed no ischemia.   She continues to have weakness and fatigue. She is not very active. She does not walk long distances and has to take frequent breaks when exerting herself secondary to fatigue and breathing. She reports being unable to walk as she is tired. She does not want physical therapy as she is too weak. She reports being able to do exercises in a sitting position which proves she is strong enough.  She reports having left hand numbness lasting for several minutes in November 2000 1202 be a TIA. She was kept in the hospital for 2 days.   She has lab work from late in 2012 showing total cholesterol 220. Pravastatin caused joint pain. Red yeast rice cause GI upset. Other cholesterol medication caused joint pain. 50-60% left internal carotid arterial disease in 2012   Cardiac catheterization performed in August 2009 showed circumflex vessel with 40% mid disease, 20-30% proximal disease, 80-90% mid to distal disease, LAD with 50% proximal, 70% mid disease, 95% ostial diagonal disease, 50% mid to distal disease of the LAD. She had stent placed to her left circumflex that was 2.5 x 14 mm endevour stent.   EKG today shows normal sinus rhythm with rate 63 beats per minute, possible old anterior infarct, interventricular conduction delay, nonspecific ST and T wave abnormality in anterolateral leads    Outpatient Encounter Prescriptions as of 03/17/2012  Medication Sig Dispense Refill  . atenolol (TENORMIN) 25 MG tablet Take 1/2 tablet twice a day.      .  calcium carbonate (OS-CAL) 600 MG TABS Take 600 mg by mouth daily.        . clopidogrel (PLAVIX) 75 MG tablet Take 75 mg by mouth daily.        Marland Kitchen ezetimibe (ZETIA) 10 MG tablet Take 10 mg by mouth daily.        Marland Kitchen FLUoxetine (PROZAC) 20 MG capsule Take 1 capsule by mouth daily.      Marland Kitchen gabapentin (NEURONTIN) 400 MG capsule Take 400 mg by mouth 3 (three) times daily.       Marland Kitchen lisinopril (PRINIVIL,ZESTRIL) 2.5 MG tablet Take 1 tablet by mouth Twice daily.      . Multiple Vitamin (MULTIVITAMIN) tablet Take 1 tablet by mouth daily.        . mupirocin ointment (BACTROBAN) 2 % as directed.      . pantoprazole (PROTONIX) 40 MG tablet Take 1 tablet by mouth Daily.      . solifenacin (VESICARE) 5 MG tablet Take 5 mg by mouth daily.       Marland Kitchen thyroid (ARMOUR) 60 MG tablet Take 60 mg by mouth daily.          Review of Systems  Constitutional: Positive for fatigue.  HENT: Negative.   Eyes: Negative.   Respiratory: Negative.   Cardiovascular: Negative.   Gastrointestinal: Negative.   Musculoskeletal: Negative.   Skin: Negative.   Neurological: Positive for weakness.  Hematological: Negative.   Psychiatric/Behavioral: Positive for dysphoric mood.  All other systems reviewed and are negative.    BP 118/64  Pulse 63  Ht 5\' 2"  (1.575 m)  Wt 97 lb (43.999 kg)  BMI 17.74 kg/m2  Physical Exam  Nursing note and vitals reviewed. Constitutional: She is oriented to person, place, and time.       Thin woman, missing several teeth  HENT:  Head: Normocephalic.  Nose: Nose normal.  Mouth/Throat: Oropharynx is clear and moist.  Eyes: Conjunctivae are normal. Pupils are equal, round, and reactive to light.  Neck: Normal range of motion. Neck supple. No JVD present.  Cardiovascular: Normal rate, regular rhythm, S1 normal, S2 normal, normal heart sounds and intact distal pulses.  Exam reveals no gallop and no friction rub.   No murmur heard. Pulses:      Carotid pulses are 2+ on the right side with bruit,  and on the left side with bruit.      Radial pulses are 2+ on the right side, and 0 on the left side.       Dorsalis pedis pulses are 1+ on the right side, and 1+ on the left side.       Posterior tibial pulses are 1+ on the right side, and 1+ on the left side.  Pulmonary/Chest: Effort normal and breath sounds normal. No respiratory distress. She has no wheezes. She has no rales. She exhibits no tenderness.  Abdominal: Soft. Bowel sounds are normal. She exhibits no distension. There is no tenderness.  Musculoskeletal: Normal range of motion. She exhibits no edema and no tenderness.  Lymphadenopathy:    She has no cervical adenopathy.  Neurological: She is alert and oriented to person, place, and time. Coordination normal.  Skin: Skin is warm and dry. No rash noted. No erythema.  Psychiatric: Her behavior is normal. Judgment and thought content normal.         Assessment and Plan

## 2012-04-14 ENCOUNTER — Inpatient Hospital Stay: Payer: Self-pay | Admitting: Internal Medicine

## 2012-04-14 LAB — URINALYSIS, COMPLETE
Bilirubin,UR: NEGATIVE
Blood: NEGATIVE
Ph: 6 (ref 4.5–8.0)
RBC,UR: 3 /HPF (ref 0–5)
Specific Gravity: 1.015 (ref 1.003–1.030)
WBC UR: 21 /HPF (ref 0–5)

## 2012-04-14 LAB — COMPREHENSIVE METABOLIC PANEL
Albumin: 3.9 g/dL (ref 3.4–5.0)
Anion Gap: 11 (ref 7–16)
Calcium, Total: 9.4 mg/dL (ref 8.5–10.1)
Chloride: 86 mmol/L — ABNORMAL LOW (ref 98–107)
Co2: 27 mmol/L (ref 21–32)
Osmolality: 253 (ref 275–301)
Potassium: 3.6 mmol/L (ref 3.5–5.1)
SGOT(AST): 30 U/L (ref 15–37)
Sodium: 124 mmol/L — ABNORMAL LOW (ref 136–145)

## 2012-04-14 LAB — TROPONIN I: Troponin-I: <0.02 ng/mL

## 2012-04-14 LAB — CBC
MCH: 31.4 pg (ref 26.0–34.0)
MCHC: 34.4 g/dL (ref 32.0–36.0)
MCV: 92 fL (ref 80–100)
RBC: 4.35 10*6/uL (ref 3.80–5.20)

## 2012-04-14 LAB — TSH: Thyroid Stimulating Horm: 3.61 u[IU]/mL

## 2012-04-14 LAB — PROTIME-INR: INR: 1

## 2012-04-14 LAB — APTT: Activated PTT: 30.4 secs (ref 23.6–35.9)

## 2012-04-15 LAB — CBC WITH DIFFERENTIAL/PLATELET
Basophil #: 0 10*3/uL (ref 0.0–0.1)
Basophil %: 0.3 %
Eosinophil #: 0 10*3/uL (ref 0.0–0.7)
Eosinophil %: 0.4 %
Lymphocyte %: 27.4 %
MCH: 31.5 pg (ref 26.0–34.0)
MCHC: 34.8 g/dL (ref 32.0–36.0)
MCV: 90 fL (ref 80–100)
Monocyte #: 0.7 x10 3/mm (ref 0.2–0.9)
Monocyte %: 9.2 %
Neutrophil #: 4.9 10*3/uL (ref 1.4–6.5)
Neutrophil %: 62.7 %
Platelet: 286 10*3/uL (ref 150–440)
RBC: 3.72 10*6/uL — ABNORMAL LOW (ref 3.80–5.20)
RDW: 13.3 % (ref 11.5–14.5)
WBC: 7.9 10*3/uL (ref 3.6–11.0)

## 2012-04-15 LAB — BASIC METABOLIC PANEL
Anion Gap: 13 (ref 7–16)
BUN: 22 mg/dL — ABNORMAL HIGH (ref 7–18)
Calcium, Total: 8.5 mg/dL (ref 8.5–10.1)
Co2: 22 mmol/L (ref 21–32)
EGFR (African American): >60
Glucose: 62 mg/dL — ABNORMAL LOW (ref 65–99)
Osmolality: 260 (ref 275–301)
Potassium: 3.9 mmol/L (ref 3.5–5.1)

## 2012-04-16 LAB — CBC WITH DIFFERENTIAL/PLATELET
Basophil #: 0 10*3/uL (ref 0.0–0.1)
Basophil %: 0.3 %
Eosinophil %: 0.5 %
HCT: 35.3 % (ref 35.0–47.0)
HGB: 12.1 g/dL (ref 12.0–16.0)
Lymphocyte #: 1.6 10*3/uL (ref 1.0–3.6)
MCH: 31.3 pg (ref 26.0–34.0)
MCHC: 34.3 g/dL (ref 32.0–36.0)
Neutrophil %: 68.2 %
RBC: 3.86 10*6/uL (ref 3.80–5.20)
RDW: 13.2 % (ref 11.5–14.5)
WBC: 7.6 10*3/uL (ref 3.6–11.0)

## 2012-04-16 LAB — BASIC METABOLIC PANEL
BUN: 11 mg/dL (ref 7–18)
Chloride: 98 mmol/L (ref 98–107)
Co2: 24 mmol/L (ref 21–32)
EGFR (African American): >60
Glucose: 86 mg/dL (ref 65–99)
Osmolality: 261 (ref 275–301)
Potassium: 4.1 mmol/L (ref 3.5–5.1)
Sodium: 131 mmol/L — ABNORMAL LOW (ref 136–145)

## 2012-04-17 LAB — BASIC METABOLIC PANEL
BUN: 6 mg/dL — ABNORMAL LOW (ref 7–18)
Calcium, Total: 8.3 mg/dL — ABNORMAL LOW (ref 8.5–10.1)
Creatinine: 0.72 mg/dL (ref 0.60–1.30)
EGFR (African American): >60
EGFR (Non-African Amer.): >60
Potassium: 3.8 mmol/L (ref 3.5–5.1)
Sodium: 130 mmol/L — ABNORMAL LOW (ref 136–145)

## 2012-05-22 DEATH — deceased

## 2012-08-14 IMAGING — CT CT HEAD WITHOUT CONTRAST
2 series · 15 of 30 positions shown, 19 images · non-contrast
Comparison: none

REASON FOR EXAM: syncope
COMMENTS:

[Series 2: without · axial · non-contrast · 0.42mm/px · z∈[-139,-19]mm · 13 of 29 slices shown, 17 images]
[im 3/29  brain]
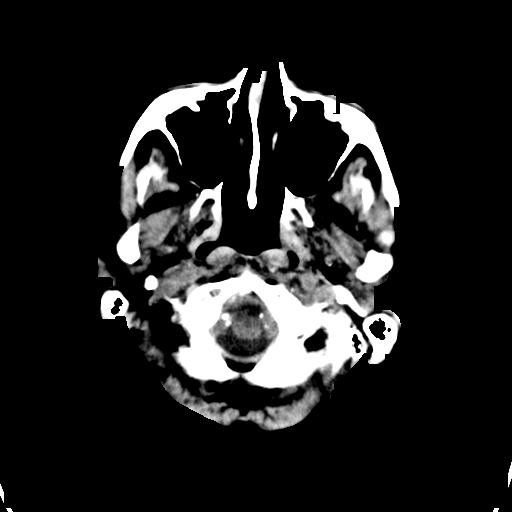
[im 3/29  bone]
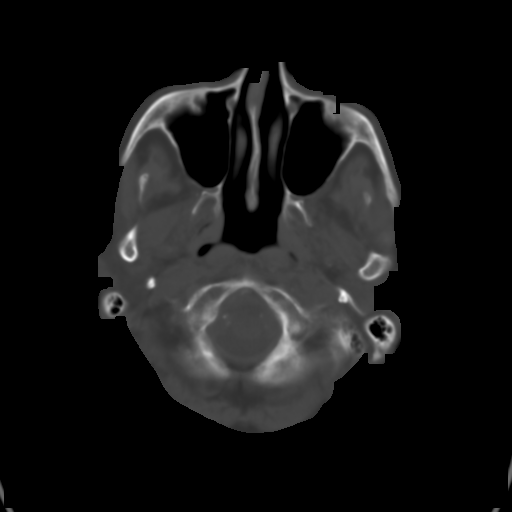
[im 5/29  brain]
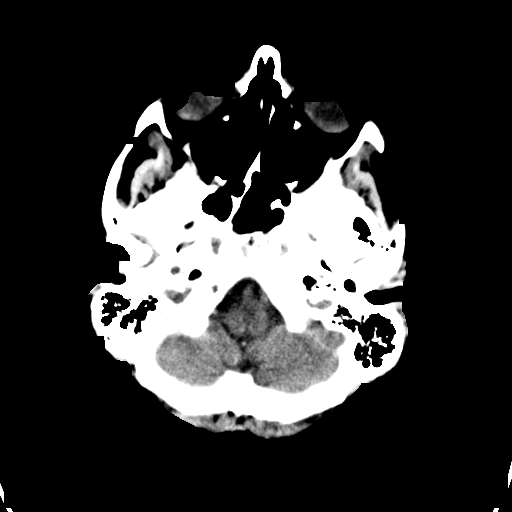
[im 7/29  brain]
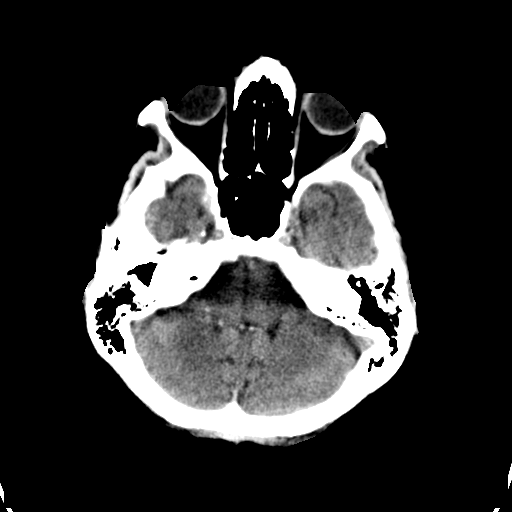
[im 9/29  brain]
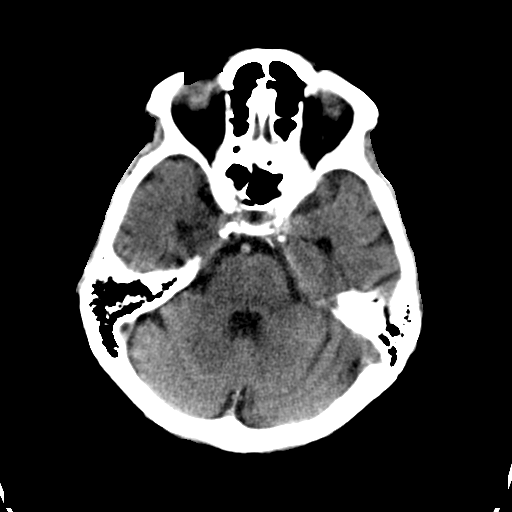
[im 11/29  brain]
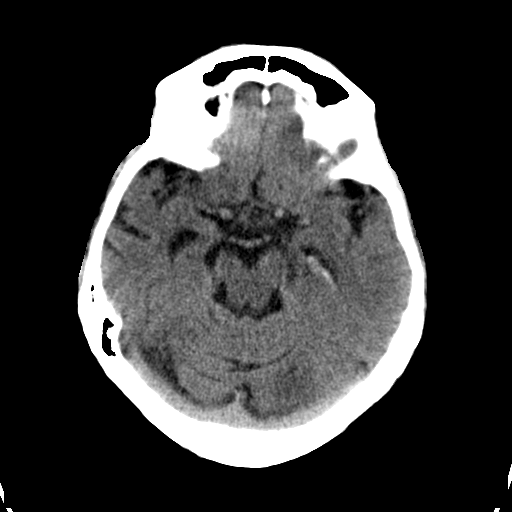
[im 11/29  bone]
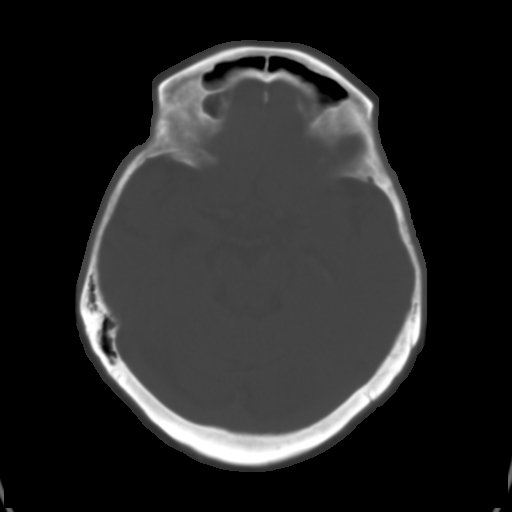
[im 13/29  brain]
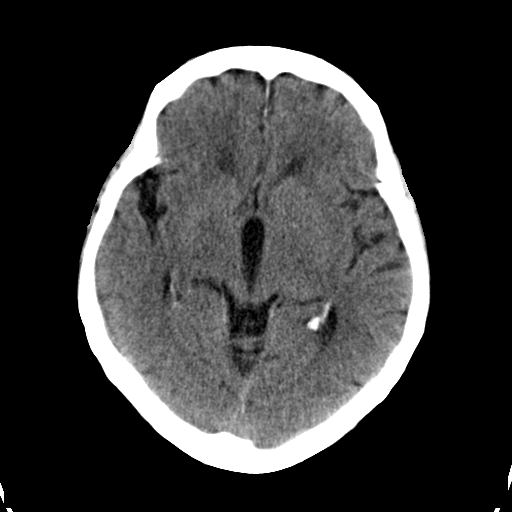
[im 15/29  brain]
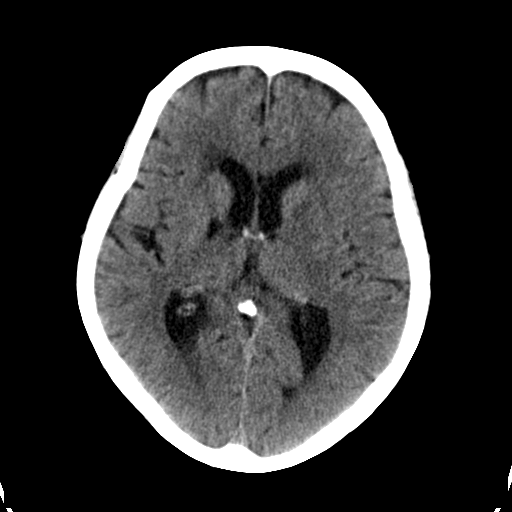
[im 17/29  brain]
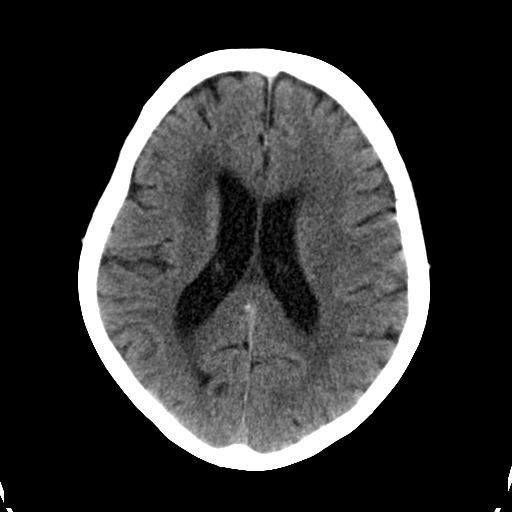
[im 19/29  brain]
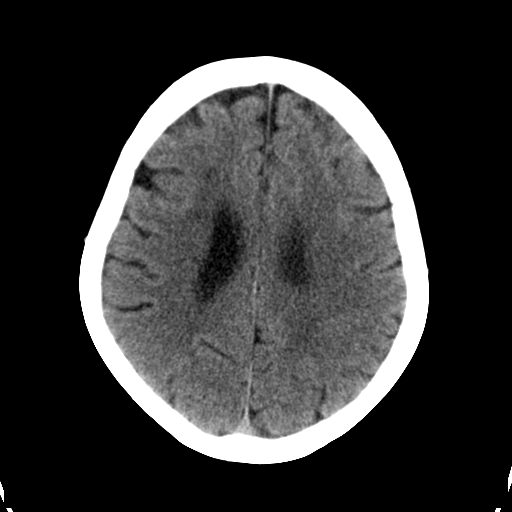
[im 19/29  bone]
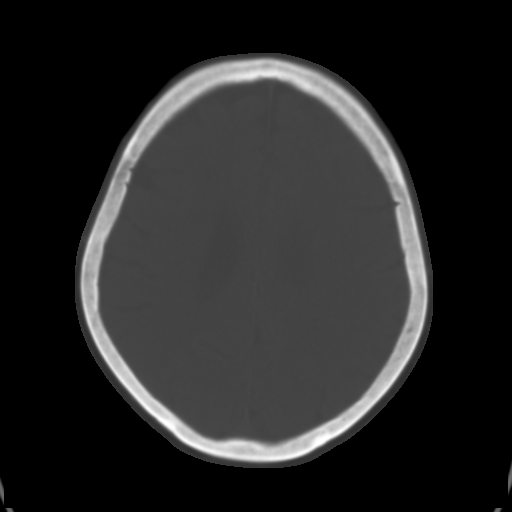
[im 21/29  brain]
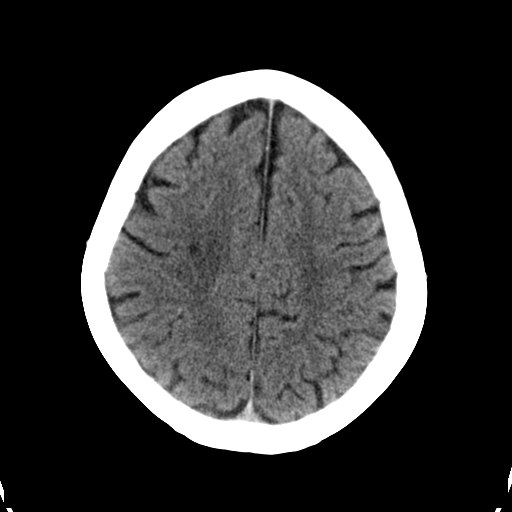
[im 23/29  brain]
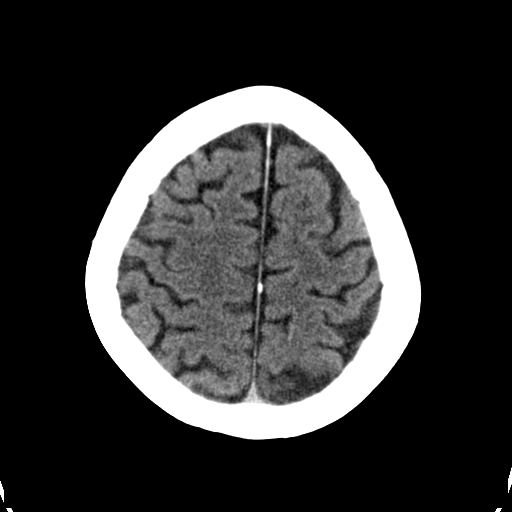
[im 25/29  brain]
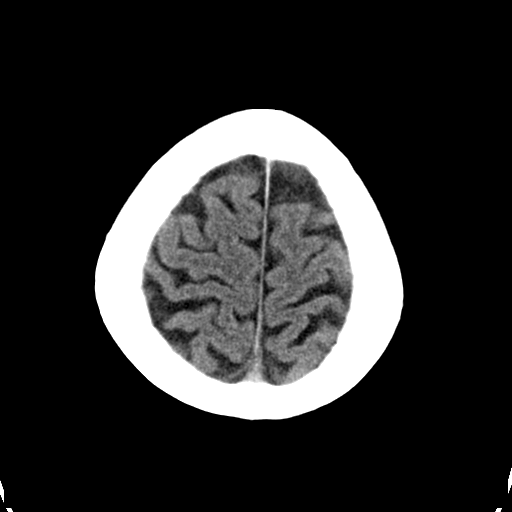
[im 27/29  brain]
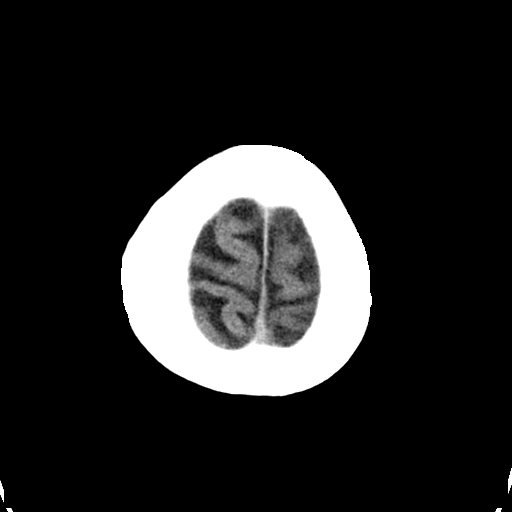
[im 27/29  bone]
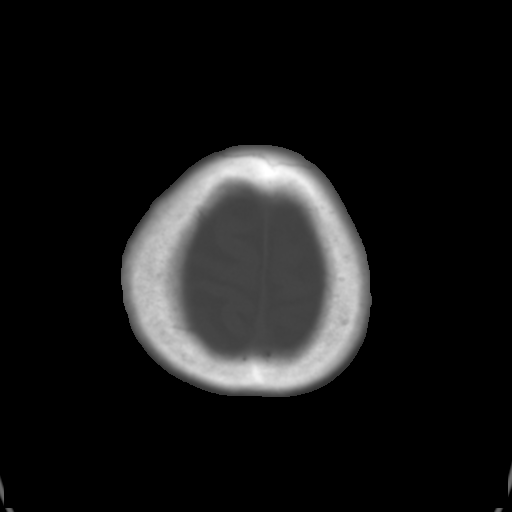

[Series 3: bone · axial · 0.42mm/px · z∈[-139,-119]mm · 2 of 29 slices shown]
[im 3/29  bone]
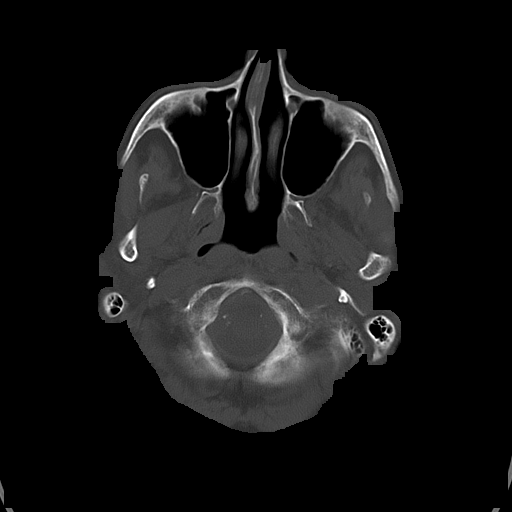
[im 7/29  bone]
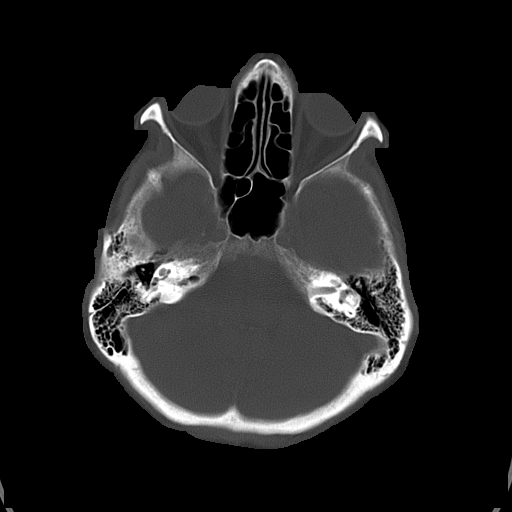

[15 of 30 positions shown; findings below may reference images not displayed]

PROCEDURE:     CT  - CT HEAD WITHOUT CONTRAST  - November 08, 2011  [DATE]

RESULT:     Axial noncontrast CT scanning was performed through the brain
with reconstructions at 5 mm intervals and slice thicknesses. Comparison is
made to the study December 13, 2007.

The ventricles are normal in size and position. There is an old lacunar
infarction in the right basal ganglia. There is mild age-appropriate diffuse
cerebral and cerebellar atrophy. There is no evidence of an acute
intracranial hemorrhage nor of an acute evolving ischemic infarction. At
bone window settings the observed portions of the paranasal sinuses and
mastoid air cells are clear. There is no evidence of an acute skull fracture.
IMPRESSION: 1. I do not see evidence of an acute ischemic or hemorrhagic infarction.
2. There is no evidence of an intracranial mass or mass effect.
3. There is no lacunar infarction in the right basal ganglia.

## 2013-01-19 IMAGING — CR DG CHEST 2V
1 series · 2 of 2 positions shown · non-contrast
Comparison: none

REASON FOR EXAM: ams
COMMENTS:

PROCEDURE:     DXR - DXR CHEST PA (OR AP) AND LATERAL  - April 14, 2012  [DATE]
RESULT:     Lungs clear. Heart unremarkable.

[Series 1: w chest pa · 0.14mm/px · 2 of 2 slices shown]
[im 1/2]
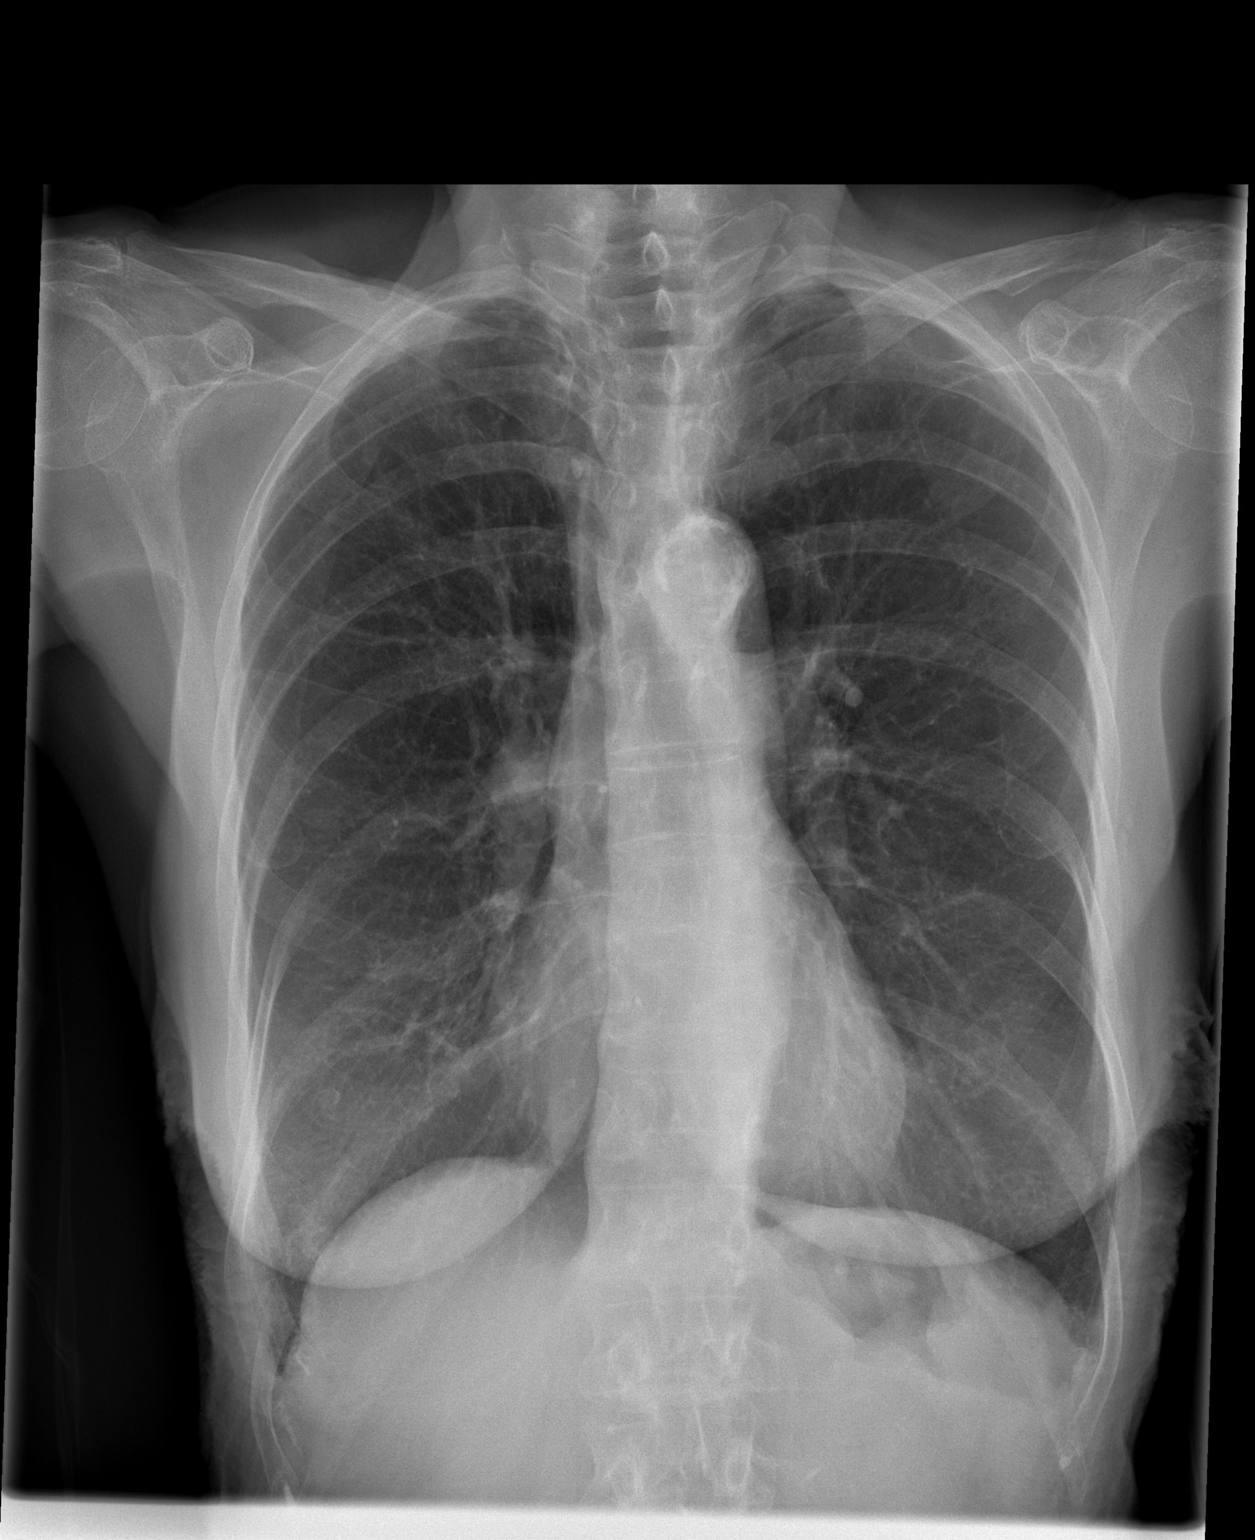
[im 2/2]
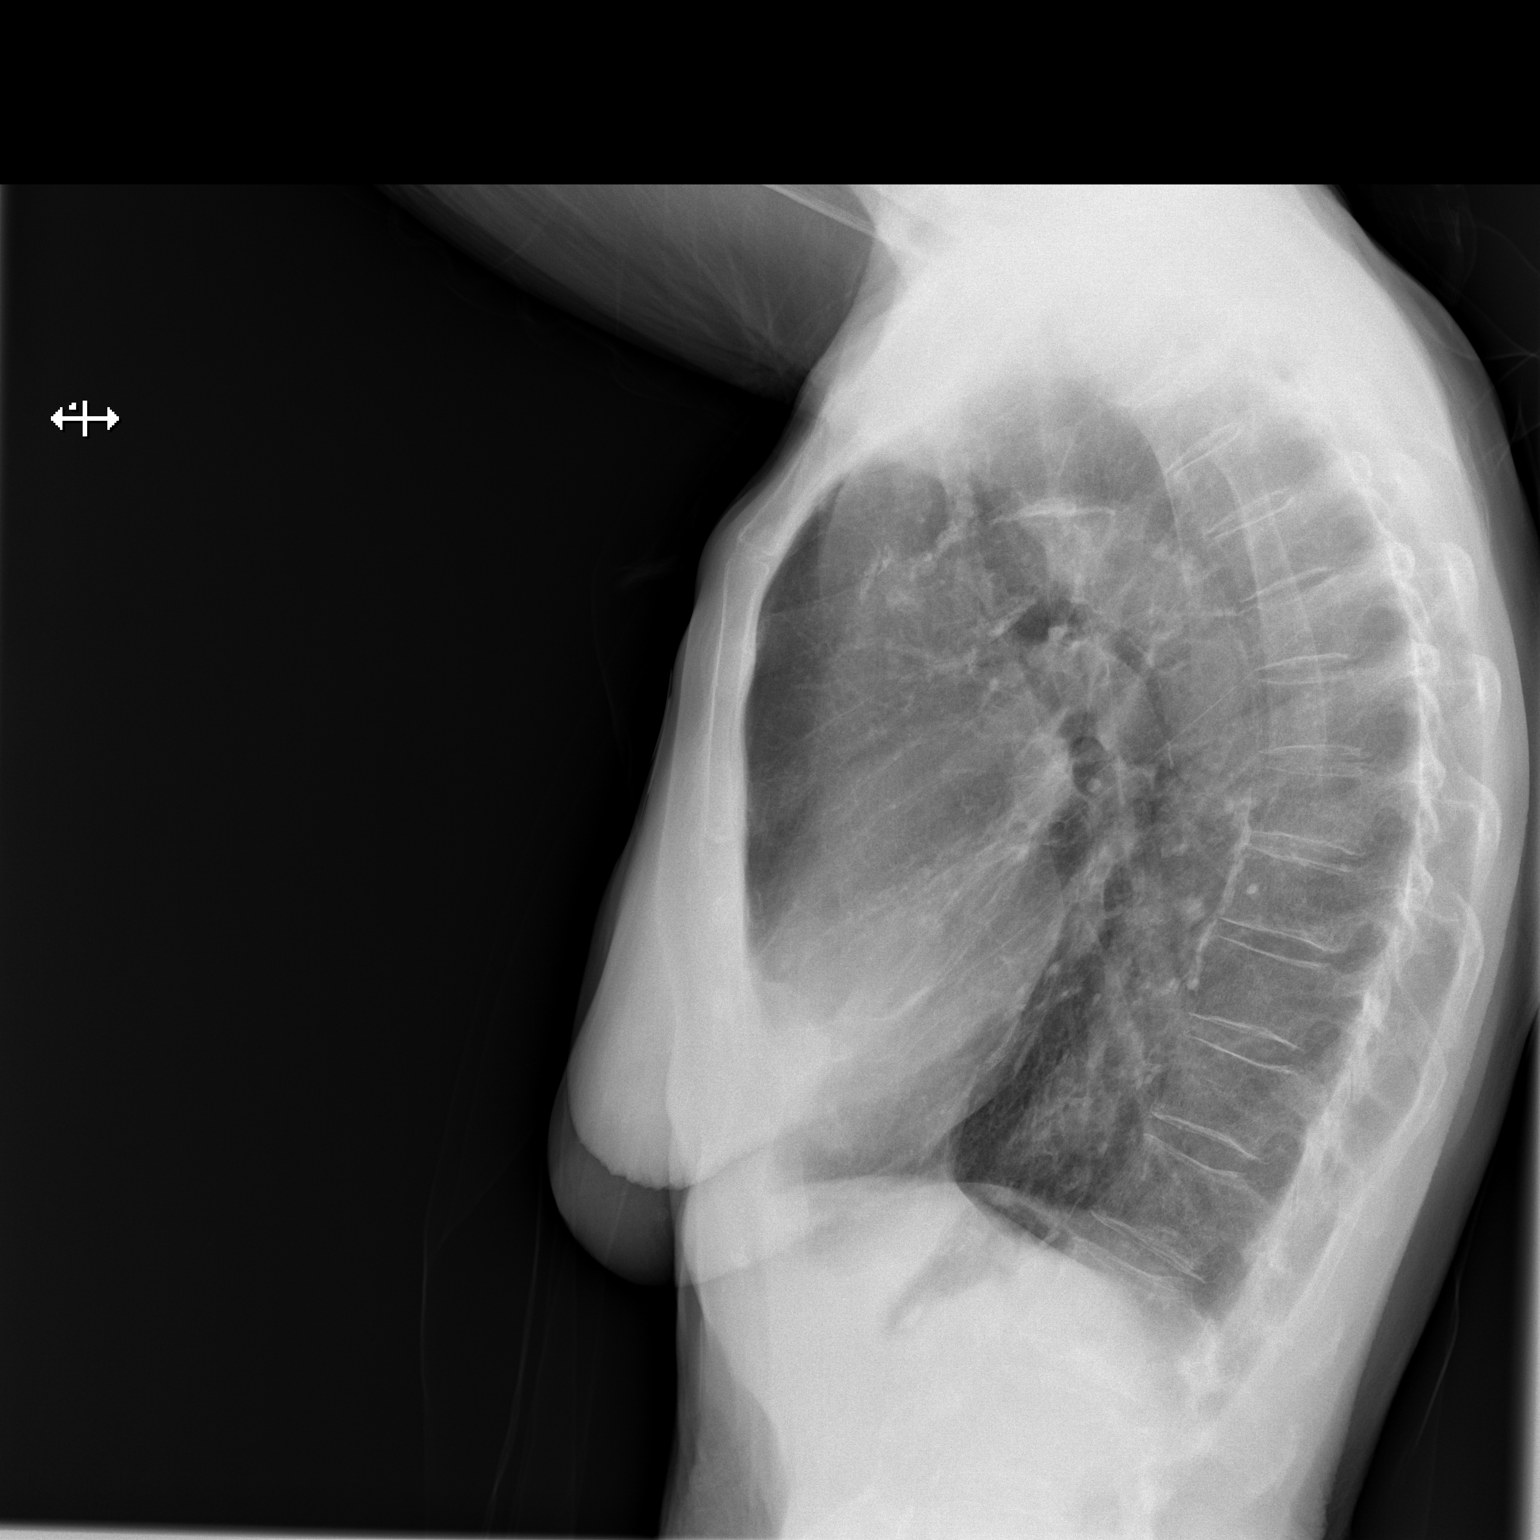

[2 of 2 positions shown; findings below may reference images not displayed]

IMPRESSION: No acute abnormality.

## 2014-02-06 ENCOUNTER — Encounter: Payer: Self-pay | Admitting: Cardiovascular Disease

## 2015-04-15 NOTE — Discharge Summary (Signed)
PATIENT NAME:  Linda Rasmussen, Linda Rasmussen MR#:  [id number] DATE OF BIRTH:  02-07-1925  DATE OF ADMISSION:  04/14/2012 DATE OF DISCHARGE:  04/17/2012  TYPE OF DISCHARGE: The patient is transferred to skilled nursing facility.   REASON FOR ADMISSION: Weakness with dehydration.   HISTORY OF PRESENT ILLNESS: The patient is an 81 year old female with a history of chronic obstructive pulmonary disease, previous stroke, neuropathy, and coronary artery disease, who presents to the Emergency Room with failure to thrive, weakness, anorexia, and recent urinary tract infection. In the Emergency Room, the patient was noted to be dehydrated and hyponatremic. She was extremely weak. She was admitted for further evaluation.   PAST MEDICAL HISTORY:  1. Previous stroke.  2. Anxiety/depression.  3. Restless leg syndrome.  4. Peripheral vascular disease.  5. Chronic obstructive pulmonary disease.  6. Peripheral neuropathy.  7. Chronic insomnia.  8. History of syncope.  9. Benign hypertension.  10. Hyperlipidemia.  11. Hypothyroidism.   MEDICATIONS ON ADMISSION: Please see admission note.   ALLERGIES: Allegra, Cipro, clindamycin, codeine, Demerol, Floxin, penicillin, sulfa, and Lyrica.   SOCIAL HISTORY: The patient has a remote history of tobacco abuse. No history of alcohol abuse. She is widowed with two children.   FAMILY HISTORY: Positive for coronary artery disease, diabetes, and stroke.   REVIEW OF SYSTEMS: As per admission note.   PHYSICAL EXAM: GENERAL: The patient was elderly, chronically ill-appearing but in no acute distress. VITAL SIGNS: Vital signs were stable and she was afebrile. HEENT exam was unremarkable. Neck was supple without jugular venous distention. Lungs were clear. Cardiac examination revealed a regular rate with a normal S1, S2. Abdomen was soft and nontender. Extremities were without edema. Neurologic exam was grossly nonfocal.   LABORATORY DATA: Laboratory data was remarkable for a sodium  of 124.   HOSPITAL COURSE: The patient was admitted with recurrent urinary tract infection, dehydration, anorexia, and failure to thrive. She was started on IV fluids with IV antibiotics. Urine culture was negative and she was switched to oral antibiotics. She improved as did her sodium. She remained weak. She was seen in consultation by physical therapy. Placement was recommended and the family agreed. The patient is now transferred to the skilled nursing facility for further care and treatment.   DISCHARGE DIAGNOSES:  1. Recurrent urinary tract infections.  2. Failure to thrive with weakness.  3. Dehydration.  4. Hyponatremia.  5. Chronic obstructive pulmonary disease.  6. Previous stroke.  7. Anxiety/depression.  8. Peripheral vascular disease with carotid stenosis.   DISCHARGE MEDICATIONS:  1. Plavix 75 mg p.o. daily.  2. Prozac 20 mg p.o. daily.  3. Zofran 4 mg p.o. every four hours p.r.n. nausea and vomiting.  4. Armour thyroid 60 mg p.o. daily.  5. Ultram 50 mg p.o. every six hours p.r.n. pain.  6. Trazodone 50 mg p.o. at bedtime p.r.n. sleep.  7. Macrobid 100 mg p.o. b.i.d. x1 week.  8. Protonix 40 mg p.o. b.i.d.   FOLLOW-UP PLANS AND APPOINTMENTS:  1. The patient will be followed by the resident physician at the skilled nursing facility.  2. She is on a mechanical soft diet with Ensure supplements b.i.d.  3. She will be seen in consultation by physical therapy.  4. Will obtain a CBC, MET-B, and urine culture in one week.    ____________________________ Leonie Douglas. Doy Hutching, MD jds:ap D: 04/17/2012 02:45:23 ET T: 04/17/2012 08:12:34 ET JOB#: [id number]  cc: Leonie Douglas. Doy Hutching, MD, <Dictator> Shantera Monts Lennice Sites MD ELECTRONICALLY SIGNED 04/17/2012 9:07

## 2015-04-15 NOTE — H&P (Signed)
PATIENT NAME:  Linda Rasmussen, Linda Rasmussen MR#:  [id number] DATE OF BIRTH:  04-17-1925  DATE OF ADMISSION:  04/14/2012  CHIEF COMPLAINT: Weakness.   HISTORY OF PRESENT ILLNESS:  Linda Rasmussen is a pleasant 81 year old female followed by Dr. Georgie Chard with a history of strokes, neuropathy, and chronic obstructive pulmonary disease as well as what appears to be coronary artery disease on isosorbide. She has had a urinary tract infection on and off for a couple of weeks. She was started on Cipro and switched to Septra a couple of days ago. She has not been able to eat and drink well. She got weaker, not talking as much. Therefore she presented to the Emergency Room where sodium was found to be 124. I was called for further evaluation. Her son and daughter are in the room. She is speaking, though only a few words at a time, but does make sense and seems to understand what is happening.   REVIEW OF SYSTEMS: Negative other than above.   PAST MEDICAL HISTORY: Per [facility] chart includes: 1. Previous stroke.  2. Anxiety, depression. 3. Restless leg syndrome. 4. Peripheral vascular disease.  5. Peripheral neuropathy.  6. Insomnia.  7. History of syncope.  8. Hypertension.  9. Hyperlipidemia. 10. Hypothyroid. 11. Chronic obstructive pulmonary disease.   ALLERGIES: No drug allergies.   SOCIAL HISTORY: Quit smoking in the past. No alcohol. Widowed with two kids.   FAMILY HISTORY: Positive for coronary artery disease, diabetes, and stroke.   MEDICATIONS: Per 04/02 note from Dr. Raul Del: 1. Plavix 75 mg daily.  2. Armour Thyroid 60 mg daily.  3. Isosorbide 30 mg daily. 4. Vesicare 5 mg daily.  5. Atenolol 25 mg daily. 6. Zetia 10 mg daily.  7. Lisinopril 2.5 daily.  8. Protonix 40 mg daily.  9. Gabapentin 400 mg t.i.d.  10. Tramadol 50 mg daily.  11. Cipro 500 mg b.i.d.  12. Prozac 20 mg daily.   PHYSICAL EXAMINATION:  GENERAL: Elderly, thin, no acute distress on the gurney.  VITAL SIGNS:  Temperature 98.3, pulse 74, respirations 20, blood pressure 111/52. On check-in at triage her weight is 95 pounds. She 5 feet 2 inches. BMI 17.   HEENT: Normocephalic, atraumatic. Oropharynx clear. Conjunctivae normal.  NECK:  No thyromegaly or jugular venous distention. Does have left-sided louder than  right-sided carotid bruit.   HEART: Regular rate and rhythm. No murmurs, rubs, or gallops.   CHEST:  Clear to auscultation percussion inspection.   ABDOMEN: Soft, flat, nontender. No hepatosplenomegaly. No bruits, masses, etc.   EXTREMITIES: No clubbing, cyanosis, or edema.   NEUROLOGICAL: Nonfocal.   SKIN: No lesions noted.   LABORATORY, DIAGNOSTIC AND RADIOLOGICAL DATA: Labs show the aforementioned sodium level of 124. Glucose 86. Chloride 86. Otherwise Met-C was unremarkable. Troponins negative. TSH is normal. Chest x-ray is normal. Hemogram is normal.   ASSESSMENT AND PLAN: Urinary tract infection causing poor intake with dehydration and hyponatremia:  We will give her some normal saline to help the sodium and the dehydration, hopefully will perk her up quickly.  I am confused about her allergies as the hospital has sulfa as an allergy, but she was apparently taking this as an outpatient, though she began this recently and has taken a turn for the worse, so I am going to hold that for now. We will give her aztreonam, which is not thought to cross-react with penicillin so she should be able to take that IV for now. Follow her Met-B in the morning  and CBC as well. Try to get a urine culture as this has not been done with past two urinary tract infections that were treated elsewhere. Dr. Doy Hutching will take over and follow up in the morning.     ____________________________ Ocie Cornfield. Ouida Sills, MD [id number] D: 04/14/2012 20:44:17 ET T: 04/15/2012 08:40:53 ET JOB#: [id number]  cc: Ocie Cornfield. Ouida Sills, MD, <Dictator> Kirk Ruths MD ELECTRONICALLY SIGNED 04/16/2012 6:29
